# Patient Record
Sex: Female | Born: 1950 | Race: Black or African American | Hispanic: No | Smoking: Never smoker
Health system: Southern US, Community
[De-identification: ages and names within clinical notes are randomized; demographics above are authoritative.]

## PROBLEM LIST (undated history)

## (undated) DIAGNOSIS — R259 Unspecified abnormal involuntary movements: Secondary | ICD-10-CM

## (undated) DIAGNOSIS — G252 Other specified forms of tremor: Secondary | ICD-10-CM

## (undated) DIAGNOSIS — I10 Essential (primary) hypertension: Secondary | ICD-10-CM

## (undated) DIAGNOSIS — R251 Tremor, unspecified: Secondary | ICD-10-CM

## (undated) DIAGNOSIS — M47812 Spondylosis without myelopathy or radiculopathy, cervical region: Secondary | ICD-10-CM

## (undated) DIAGNOSIS — E7800 Pure hypercholesterolemia, unspecified: Secondary | ICD-10-CM

## (undated) HISTORY — DX: Unspecified abnormal involuntary movements: R25.9

## (undated) HISTORY — PX: OTHER SURGICAL HISTORY: SHX169

## (undated) HISTORY — PX: GANGLION CYST EXCISION: SHX1691

## (undated) HISTORY — DX: Tremor, unspecified: R25.1

## (undated) HISTORY — DX: Spondylosis without myelopathy or radiculopathy, cervical region: M47.812

## (undated) HISTORY — DX: Other specified forms of tremor: G25.2

---

## 1998-01-08 ENCOUNTER — Emergency Department (HOSPITAL_COMMUNITY): Admission: EM | Admit: 1998-01-08 | Discharge: 1998-01-09 | Payer: Self-pay | Admitting: Emergency Medicine

## 2007-03-09 ENCOUNTER — Encounter: Admission: RE | Admit: 2007-03-09 | Discharge: 2007-03-09 | Payer: Self-pay | Admitting: Family Medicine

## 2007-05-06 ENCOUNTER — Emergency Department (HOSPITAL_COMMUNITY): Admission: EM | Admit: 2007-05-06 | Discharge: 2007-05-07 | Payer: Self-pay | Admitting: Emergency Medicine

## 2007-05-08 ENCOUNTER — Ambulatory Visit (HOSPITAL_BASED_OUTPATIENT_CLINIC_OR_DEPARTMENT_OTHER): Admission: RE | Admit: 2007-05-08 | Discharge: 2007-05-09 | Payer: Self-pay | Admitting: Orthopedic Surgery

## 2008-02-29 ENCOUNTER — Encounter: Admission: RE | Admit: 2008-02-29 | Discharge: 2008-02-29 | Payer: Self-pay | Admitting: Family Medicine

## 2008-03-12 ENCOUNTER — Encounter: Admission: RE | Admit: 2008-03-12 | Discharge: 2008-03-12 | Payer: Self-pay | Admitting: Family Medicine

## 2010-03-09 ENCOUNTER — Encounter (INDEPENDENT_AMBULATORY_CARE_PROVIDER_SITE_OTHER): Payer: Self-pay | Admitting: *Deleted

## 2010-03-09 LAB — CONVERTED CEMR LAB
ALT: 27 units/L (ref 0–35)
BUN: 17 mg/dL (ref 6–23)
CO2: 29 meq/L (ref 19–32)
Calcium: 9.8 mg/dL (ref 8.4–10.5)
Chloride: 102 meq/L (ref 96–112)
Cholesterol: 244 mg/dL — ABNORMAL HIGH (ref 0–200)
Creatinine, Ser: 0.73 mg/dL (ref 0.40–1.20)
Glucose, Bld: 91 mg/dL (ref 70–99)
HCT: 40.4 % (ref 36.0–46.0)
Hemoglobin: 12.4 g/dL (ref 12.0–15.0)
MCV: 78.1 fL (ref 78.0–100.0)
RBC: 5.17 M/uL — ABNORMAL HIGH (ref 3.87–5.11)
Total Bilirubin: 0.7 mg/dL (ref 0.3–1.2)
Total CHOL/HDL Ratio: 3.8
Triglycerides: 100 mg/dL (ref <150)
VLDL: 20 mg/dL (ref 0–40)
WBC: 4.5 10*3/uL (ref 4.0–10.5)

## 2010-03-18 ENCOUNTER — Encounter: Admission: RE | Admit: 2010-03-18 | Discharge: 2010-03-18 | Payer: Self-pay | Admitting: Family Medicine

## 2010-05-21 ENCOUNTER — Other Ambulatory Visit: Payer: Self-pay | Admitting: Family Medicine

## 2010-05-21 ENCOUNTER — Other Ambulatory Visit (HOSPITAL_COMMUNITY): Payer: Self-pay | Admitting: Family Medicine

## 2010-05-21 ENCOUNTER — Other Ambulatory Visit (HOSPITAL_COMMUNITY)
Admission: RE | Admit: 2010-05-21 | Discharge: 2010-05-21 | Disposition: A | Discharge status: Home / Self Care | Payer: Self-pay | Source: Ambulatory Visit | Attending: Family Medicine | Admitting: Family Medicine

## 2010-05-21 DIAGNOSIS — R319 Hematuria, unspecified: Secondary | ICD-10-CM | POA: Insufficient documentation

## 2010-05-22 ENCOUNTER — Encounter (INDEPENDENT_AMBULATORY_CARE_PROVIDER_SITE_OTHER): Payer: Self-pay | Admitting: *Deleted

## 2010-05-22 LAB — CONVERTED CEMR LAB
Casts: NONE SEEN /lpf
Chlamydia, DNA Probe: NEGATIVE
Crystals: NONE SEEN
GC Probe Amp, Genital: NEGATIVE

## 2010-05-24 ENCOUNTER — Encounter (HOSPITAL_COMMUNITY): Payer: Self-pay

## 2010-05-24 ENCOUNTER — Ambulatory Visit (HOSPITAL_COMMUNITY)
Admission: RE | Admit: 2010-05-24 | Discharge: 2010-05-24 | Disposition: A | Discharge status: Home / Self Care | Payer: Self-pay | Source: Ambulatory Visit | Attending: Family Medicine | Admitting: Family Medicine

## 2010-05-24 DIAGNOSIS — R109 Unspecified abdominal pain: Secondary | ICD-10-CM | POA: Insufficient documentation

## 2010-05-24 DIAGNOSIS — K5730 Diverticulosis of large intestine without perforation or abscess without bleeding: Secondary | ICD-10-CM | POA: Insufficient documentation

## 2010-05-24 DIAGNOSIS — R319 Hematuria, unspecified: Secondary | ICD-10-CM | POA: Insufficient documentation

## 2010-05-24 DIAGNOSIS — M5137 Other intervertebral disc degeneration, lumbosacral region: Secondary | ICD-10-CM | POA: Insufficient documentation

## 2010-05-24 DIAGNOSIS — M51379 Other intervertebral disc degeneration, lumbosacral region without mention of lumbar back pain or lower extremity pain: Secondary | ICD-10-CM | POA: Insufficient documentation

## 2010-08-31 NOTE — Op Note (Signed)
NAMEACHSAH, MCQUADE NO.:  [id number]   MEDICAL RECORD NO.:  [id number]          PATIENT TYPE:  AMB   LOCATION:  DSC                          FACILITY:  [facility]   PHYSICIAN:  Dyke Brackett, M.D.    DATE OF BIRTH:  August 15, 1950   DATE OF PROCEDURE:  05/08/2007  DATE OF DISCHARGE:                               OPERATIVE REPORT   INDICATIONS:  A 60 year old with a displaced trimalleolar ankle fracture  thought to be amenable to outpatient surgery.   PRE-AND-POSTOPERATIVE DIAGNOSIS:  Mildly displaced trimalleolar ankle  fracture.   OPERATION:  Open reduction, internal fixation trimalleolar ankle  fracture (six-hole semitubular plate with lag screw, 240 cancellus  screws).   SURGEON:  Dyke Brackett, M.D.   ASSISTANT:  Legrand Pitts. Duffy, P.A.   TOURNIQUET TIME:  1 hour.   DESCRIPTION OF PROCEDURE:  Sterile prep and drape, exsanguination of the  leg, and placed into 350.  Straight skin incision was made.  Identification of oblique Weber type-B fibular fracture which was fixed  with a six-hole plate with appropriate vent in the plate.  Moderate Lee  good purchase was obtained distally with one lag screw placed front-to-  back in the fracture with anatomic reduction. confirmed in 3 plains.  This was followed by opening the medial side of the curvilinear incision  with an anatomic reduction of a medial malleolus fracture with fixation  with two 4-0 cancellus screws, again, anatomic reduction with good  compression of the fracture site confirmation of screws all within bone  and a good reduction on AP lateral and mortise views.  The wounds were  irrigated closed with #0 Vicryl, 2-0 Vicryl, and skin clips.  A lightly  compressive sterile dressing and a posterior splint applied.  Taken to  recovery room in stable condition.      Dyke Brackett, M.D.  Electronically Signed     WDC/MEDQ  D:  05/08/2007  T:  05/08/2007  Job:  [id number]

## 2011-01-07 LAB — POCT HEMOGLOBIN-HEMACUE: Hemoglobin: 11.6 — ABNORMAL LOW

## 2011-02-24 ENCOUNTER — Other Ambulatory Visit: Payer: Self-pay | Admitting: Family Medicine

## 2011-02-24 DIAGNOSIS — Z1231 Encounter for screening mammogram for malignant neoplasm of breast: Secondary | ICD-10-CM

## 2011-03-25 ENCOUNTER — Ambulatory Visit: Payer: Self-pay

## 2011-04-01 ENCOUNTER — Ambulatory Visit
Admission: RE | Admit: 2011-04-01 | Discharge: 2011-04-01 | Disposition: A | Discharge status: Home / Self Care | Payer: Self-pay | Source: Ambulatory Visit | Attending: Family Medicine | Admitting: Family Medicine

## 2011-04-01 DIAGNOSIS — Z1231 Encounter for screening mammogram for malignant neoplasm of breast: Secondary | ICD-10-CM

## 2012-09-05 ENCOUNTER — Other Ambulatory Visit (HOSPITAL_COMMUNITY): Payer: Self-pay | Admitting: Family Medicine

## 2012-09-05 DIAGNOSIS — Z1231 Encounter for screening mammogram for malignant neoplasm of breast: Secondary | ICD-10-CM

## 2012-09-20 ENCOUNTER — Ambulatory Visit (HOSPITAL_COMMUNITY)
Admission: RE | Admit: 2012-09-20 | Discharge: 2012-09-20 | Disposition: A | Discharge status: Home / Self Care | Payer: No Typology Code available for payment source | Source: Ambulatory Visit | Attending: Family Medicine | Admitting: Family Medicine

## 2012-09-20 DIAGNOSIS — Z1231 Encounter for screening mammogram for malignant neoplasm of breast: Secondary | ICD-10-CM

## 2013-08-22 ENCOUNTER — Other Ambulatory Visit (HOSPITAL_COMMUNITY): Payer: Self-pay | Admitting: Family Medicine

## 2013-08-22 DIAGNOSIS — Z1231 Encounter for screening mammogram for malignant neoplasm of breast: Secondary | ICD-10-CM

## 2013-09-13 ENCOUNTER — Other Ambulatory Visit: Payer: Self-pay | Admitting: Orthopedic Surgery

## 2013-09-13 DIAGNOSIS — R251 Tremor, unspecified: Secondary | ICD-10-CM

## 2013-09-20 ENCOUNTER — Other Ambulatory Visit: Payer: No Typology Code available for payment source

## 2013-09-23 ENCOUNTER — Encounter (INDEPENDENT_AMBULATORY_CARE_PROVIDER_SITE_OTHER): Payer: Self-pay

## 2013-09-23 ENCOUNTER — Ambulatory Visit (HOSPITAL_COMMUNITY): Payer: No Typology Code available for payment source

## 2013-09-23 ENCOUNTER — Ambulatory Visit
Admission: RE | Admit: 2013-09-23 | Discharge: 2013-09-23 | Disposition: A | Discharge status: Home / Self Care | Payer: Commercial Managed Care - HMO | Source: Ambulatory Visit | Attending: Orthopedic Surgery | Admitting: Orthopedic Surgery

## 2013-09-23 ENCOUNTER — Other Ambulatory Visit: Payer: Self-pay | Admitting: Orthopedic Surgery

## 2013-09-23 DIAGNOSIS — IMO0002 Reserved for concepts with insufficient information to code with codable children: Secondary | ICD-10-CM

## 2013-09-23 DIAGNOSIS — R251 Tremor, unspecified: Secondary | ICD-10-CM

## 2013-09-24 ENCOUNTER — Ambulatory Visit (HOSPITAL_COMMUNITY)
Admission: RE | Admit: 2013-09-24 | Discharge: 2013-09-24 | Disposition: A | Discharge status: Home / Self Care | Payer: Medicare HMO | Source: Ambulatory Visit | Attending: Family Medicine | Admitting: Family Medicine

## 2013-09-24 DIAGNOSIS — Z1231 Encounter for screening mammogram for malignant neoplasm of breast: Secondary | ICD-10-CM

## 2013-09-25 ENCOUNTER — Other Ambulatory Visit: Payer: Self-pay | Admitting: Family Medicine

## 2013-09-25 DIAGNOSIS — R928 Other abnormal and inconclusive findings on diagnostic imaging of breast: Secondary | ICD-10-CM

## 2013-10-15 ENCOUNTER — Ambulatory Visit
Admission: RE | Admit: 2013-10-15 | Discharge: 2013-10-15 | Disposition: A | Discharge status: Home / Self Care | Payer: Commercial Managed Care - HMO | Source: Ambulatory Visit | Attending: Family Medicine | Admitting: Family Medicine

## 2013-10-15 DIAGNOSIS — R928 Other abnormal and inconclusive findings on diagnostic imaging of breast: Secondary | ICD-10-CM

## 2014-01-02 ENCOUNTER — Encounter: Payer: Self-pay | Admitting: Neurology

## 2014-01-03 ENCOUNTER — Ambulatory Visit: Payer: Commercial Managed Care - HMO | Admitting: Neurology

## 2014-01-16 ENCOUNTER — Ambulatory Visit (INDEPENDENT_AMBULATORY_CARE_PROVIDER_SITE_OTHER): Payer: Medicare HMO | Admitting: Neurology

## 2014-01-16 ENCOUNTER — Encounter: Payer: Self-pay | Admitting: Neurology

## 2014-01-16 ENCOUNTER — Encounter (INDEPENDENT_AMBULATORY_CARE_PROVIDER_SITE_OTHER): Payer: Self-pay

## 2014-01-16 VITALS — BP 106/61 | HR 65 | Ht 62.5 in | Wt 150.0 lb

## 2014-01-16 DIAGNOSIS — R251 Tremor, unspecified: Secondary | ICD-10-CM

## 2014-01-16 HISTORY — DX: Tremor, unspecified: R25.1

## 2014-01-16 NOTE — Patient Instructions (Signed)

## 2014-01-16 NOTE — Progress Notes (Signed)
Reason for visit: Tremor  Carol BushmanDorothy Freeman is a 63 y.o. female  History of present illness:  Ms. Carol Freeman is a 63 year old right-handed black female with a history of tremor that she believes began about 4 months ago. The patient indicates that the tremor involves the right arm only, and she generally will note the tremor when she is performing handwriting, or trying to floss her teeth. The patient may drop things from the right hand. She reports no numbness or weakness or discomfort involving the arm. She denies any family history of tremor. She denies any tremor involving the head or neck, or any vocal tremor. The patient denies any balance problems or difficulty controlling the bowels or the bladder. She indicates that the tremor does not prevent her from performing any activities of daily living. The patient has undergone a CT scan of the brain in June of 2015, and the study was unremarkable. The patient is sent to this office for an evaluation. The patient has good days and bad days with the tremor.  Past Medical History  Diagnosis Date  . Cervical spondylosis   . Resting tremor     hand  . Tremor of right hand 01/16/2014    Past Surgical History  Procedure Laterality Date  . Ganglion cyst excision    . Broken leg    . Still born      Family History  Problem Relation Age of Onset  . Tremor Neg Hx     Social history:  reports that she has never smoked. She has never used smokeless tobacco. She reports that she drinks alcohol. She reports that she does not use illicit drugs.  Medications:  No current outpatient prescriptions on file prior to visit.   No current facility-administered medications on file prior to visit.      Allergies  Allergen Reactions  . Lipitor [Atorvastatin] Other (See Comments)    ROS:  Out of a complete 14 system review of symptoms, the patient complains only of the following symptoms, and all other reviewed systems are negative.  Fatigue Achy  muscles Weakness, tremor Not enough sleep Restless legs  Blood pressure 106/61, pulse 65, height 5' 2.5" (1.588 m), weight 150 lb (68.04 kg).  Physical Exam  General: The patient is alert and cooperative at the time of the examination.  Eyes: Pupils are equal, round, and reactive to light. Discs are flat bilaterally.  Neck: The neck is supple, no carotid bruits are noted.  Respiratory: The respiratory examination is clear.  Cardiovascular: The cardiovascular examination reveals a regular rate and rhythm, no obvious murmurs or rubs are noted.  Skin: Extremities are without significant edema.  Neurologic Exam  Mental status: The patient is alert and oriented x 3 at the time of the examination. The patient has apparent normal recent and remote memory, with an apparently normal attention span and concentration ability.  Cranial nerves: Facial symmetry is present. There is good sensation of the face to pinprick and soft touch bilaterally. The strength of the facial muscles and the muscles to head turning and shoulder shrug are normal bilaterally. Speech is well enunciated, no aphasia or dysarthria is noted. Extraocular movements are full. Visual fields are full. The tongue is midline, and the patient has symmetric elevation of the soft palate. No obvious hearing deficits are noted.  Motor: The motor testing reveals 5 over 5 strength of all 4 extremities. Good symmetric motor tone is noted throughout.  Sensory: Sensory testing is intact to pinprick,  soft touch, vibration sensation, and position sense on all 4 extremities. No evidence of extinction is noted.  Coordination: Cerebellar testing reveals good finger-nose-finger and heel-to-shin bilaterally. The patient does have a mild intention tremor with finger-nose-finger on the right arm, trace tremor on the left.  Gait and station: Gait is normal. Tandem gait is normal. Romberg is negative. No drift is seen.  Reflexes: Deep tendon  reflexes are symmetric and normal bilaterally. Toes are downgoing bilaterally.   Assessment/Plan:  1. Probable essential tremor  The patient will be set up for blood work today, and MRI evaluation of the brain. The patient does not wish to go on medications currently for the tremor. She will followup in about 6 months, and we will reevaluate the tremor that time. If needed, medications can be started in the future.   Carol Palau MD 01/17/2014 5:57 AM  [facility] [address] [place], [place] [address]  Phone [phone] Fax [phone]

## 2014-01-31 ENCOUNTER — Telehealth: Payer: Self-pay | Admitting: Neurology

## 2014-01-31 ENCOUNTER — Ambulatory Visit
Admission: RE | Admit: 2014-01-31 | Discharge: 2014-01-31 | Disposition: A | Discharge status: Home / Self Care | Payer: Commercial Managed Care - HMO | Source: Ambulatory Visit | Attending: Neurology | Admitting: Neurology

## 2014-01-31 DIAGNOSIS — R251 Tremor, unspecified: Secondary | ICD-10-CM

## 2014-01-31 NOTE — Telephone Encounter (Signed)
  I called patient. MRI the brain is almost normal, only a few small white matter lesions. I discussed this with the patient.  MRI brain 01/31/2014:  Impression   Mildly abnormal MRI brain (without) demonstrating: 1. Few scattered periventricular and subcortical foci of non-specific  gliosis, likely chronic small vessel ischemic disease.  2. No acute findings.

## 2014-03-05 ENCOUNTER — Encounter: Payer: Self-pay | Admitting: Neurology

## 2014-03-11 ENCOUNTER — Encounter: Payer: Self-pay | Admitting: Neurology

## 2014-07-18 ENCOUNTER — Ambulatory Visit: Payer: Medicare HMO | Admitting: Adult Health

## 2014-07-22 ENCOUNTER — Ambulatory Visit (INDEPENDENT_AMBULATORY_CARE_PROVIDER_SITE_OTHER): Payer: Medicare HMO | Admitting: Adult Health

## 2014-07-22 ENCOUNTER — Encounter: Payer: Self-pay | Admitting: Adult Health

## 2014-07-22 VITALS — BP 111/69 | HR 81 | Ht 62.5 in | Wt 156.0 lb

## 2014-07-22 DIAGNOSIS — G250 Essential tremor: Secondary | ICD-10-CM

## 2014-07-22 NOTE — Progress Notes (Signed)
PATIENT: Carol BushmanDorothy Freeman DOB: [birth year]  REASON FOR VISIT: follow up- tremor HISTORY FROM: patient  HISTORY OF PRESENT ILLNESS: Ms. Carol Freeman is a 64 year old female with a history of benign essential tremor. She returns today for follow-up. The patient states that she continues to have a tremor in the right hand. She continues tp notice it when she is writing or flossing her teeth. She states she will occasionally notice it when she is drinking tea--she has to hold the cup with 2 hands. At this time she denies having any issues when she is eating. Overall she feels that the tremor has not changed much. She would rather not be on medication unless it gets worse. She denies any new neurological symptoms. Denies any new medical issues. She returns today for evaluation  HISTORY 01/16/14 (Carol Freeman): Ms. Carol Freeman is a 64 year old right-handed black female with a history of tremor that she believes began about 4 months ago. The patient indicates that the tremor involves the right arm only, and she generally will note the tremor when she is performing handwriting, or trying to floss her teeth. The patient may drop things from the right hand. She reports no numbness or weakness or discomfort involving the arm. She denies any family history of tremor. She denies any tremor involving the head or neck, or any vocal tremor. The patient denies any balance problems or difficulty controlling the bowels or the bladder. She indicates that the tremor does not prevent her from performing any activities of daily living. The patient has undergone a CT scan of the brain in June of 2015, and the study was unremarkable. The patient is sent to this office for an evaluation. The patient has good days and bad days with the tremor.  REVIEW OF SYSTEMS: Out of a complete 14 system review of symptoms, the patient complains only of the following symptoms, and all other reviewed systems are negative.  See HPI   ALLERGIES: Allergies    Allergen Reactions  . Lipitor [Atorvastatin] Other (See Comments)    HOME MEDICATIONS: Outpatient Prescriptions Prior to Visit  Medication Sig Dispense Refill  . aspirin 81 MG tablet Take 81 mg by mouth every other day.    . lisinopril-hydrochlorothiazide (PRINZIDE,ZESTORETIC) 10-12.5 MG per tablet     . pravastatin (PRAVACHOL) 20 MG tablet      No facility-administered medications prior to visit.    PAST MEDICAL HISTORY: Past Medical History  Diagnosis Date  . Cervical spondylosis   . Resting tremor     hand  . Tremor of right hand 01/16/2014    PAST SURGICAL HISTORY: Past Surgical History  Procedure Laterality Date  . Ganglion cyst excision    . Broken leg    . Still born      FAMILY HISTORY: Family History  Problem Relation Age of Onset  . Tremor Neg Hx     SOCIAL HISTORY: History   Social History  . Marital Status: Divorced    Spouse Name: N/A  . Number of Children: 1  . Years of Education: 12   Occupational History  . Retired    Social History Main Topics  . Smoking status: Never Smoker   . Smokeless tobacco: Never Used  . Alcohol Use: Yes     Comment: Red wine once in a while  . Drug Use: No  . Sexual Activity: Not on file   Other Topics Concern  . Not on file   Social History Narrative   Patient lives alone.  Patient is divorced.    Patient has 1 child.    Patient right handed.    Patient is retired.    Patient has a high school education.       PHYSICAL EXAM  Filed Vitals:   07/22/14 1432  BP: 111/69  Pulse: 81  Height: 5' 2.5" (1.588 m)  Weight: 156 lb (70.761 kg)   Body mass index is 28.06 kg/(m^2).  Generalized: Well developed, in no acute distress   Neurological examination  Mentation: Alert oriented to time, place, history taking. Follows all commands speech and language fluent Cranial nerve II-XII: Pupils were equal round reactive to light. Extraocular movements were full, visual field were full on confrontational  test. Facial sensation and strength were normal. Uvula tongue midline. Head turning and shoulder shrug  were normal and symmetric. Motor: The motor testing reveals 5 over 5 strength of all 4 extremities. Good symmetric motor tone is noted throughout. Intention tremor noted in the right hand. Sensory: Sensory testing is intact to soft touch on all 4 extremities. No evidence of extinction is noted.  Coordination: Cerebellar testing reveals good finger-nose-finger and heel-to-shin bilaterally.  Gait and station: Gait is normal.  Reflexes: Deep tendon reflexes are symmetric and normal bilaterally.  Marland Kitchen   DIAGNOSTIC DATA (LABS, IMAGING, TESTING) - I reviewed patient records, labs, notes, testing and imaging myself where available.      ASSESSMENT AND PLAN 64 y.o. year old female  has a past medical history of Cervical spondylosis; Resting tremor; and Tremor of right hand (01/16/2014). here with:  1. Benign essential tremor  Patient's tremor has remained the same. At this time the patient would rather not have any medication. I have explained to the patient that if her tremor worsens we can try daily medication or medication as needed. She verbalized understanding. She will let us know if her tremor worsens. She will follow-up in 6 months or sooner if needed.  Carol Freeman, Carol Freeman, Carol Freeman 07/22/2014, 2:49 PM [facility] [address], [address] [place], [place] [address] [phone]  Note: This document was prepared with digital dictation and possible smart phrase technology. Any transcriptional errors that result from this process are unintentional.

## 2014-07-22 NOTE — Patient Instructions (Signed)
Overall you are doing well. Your tremor is mild. If it worsens please let us know and at that time we can consider medication.

## 2014-07-22 NOTE — Progress Notes (Signed)
I have read the note, and I agree with the clinical assessment and plan.  Lexine Jaspers KEITH   

## 2014-09-24 ENCOUNTER — Other Ambulatory Visit (HOSPITAL_COMMUNITY): Payer: Self-pay | Admitting: Internal Medicine

## 2014-09-24 DIAGNOSIS — Z1231 Encounter for screening mammogram for malignant neoplasm of breast: Secondary | ICD-10-CM

## 2014-10-24 ENCOUNTER — Ambulatory Visit (HOSPITAL_COMMUNITY)
Admission: RE | Admit: 2014-10-24 | Discharge: 2014-10-24 | Disposition: A | Discharge status: Home / Self Care | Payer: Medicare HMO | Source: Ambulatory Visit | Attending: Internal Medicine | Admitting: Internal Medicine

## 2014-10-24 DIAGNOSIS — Z1231 Encounter for screening mammogram for malignant neoplasm of breast: Secondary | ICD-10-CM | POA: Diagnosis present

## 2015-01-19 ENCOUNTER — Encounter: Payer: Self-pay | Admitting: Adult Health

## 2015-01-19 ENCOUNTER — Ambulatory Visit (INDEPENDENT_AMBULATORY_CARE_PROVIDER_SITE_OTHER): Payer: Medicare HMO | Admitting: Adult Health

## 2015-01-19 VITALS — BP 114/68 | HR 65 | Ht 62.5 in | Wt 154.8 lb

## 2015-01-19 DIAGNOSIS — G250 Essential tremor: Secondary | ICD-10-CM

## 2015-01-19 NOTE — Patient Instructions (Signed)
We will continue to monitor tremor.  If it gets worse please let us know.

## 2015-01-19 NOTE — Progress Notes (Addendum)
PATIENT: Carol Freeman DOB: 07-05-1950  REASON FOR VISIT: follow up-benign essential tremor HISTORY FROM: patient  HISTORY OF PRESENT ILLNESS: Ms. Carol Freeman is a 64 year old female with a history of benign essential tremor. She returns today for follow-up. The patient is currently not on any medications for her tremors. She states that she primarily notices that if she becomes very nervous or  is in a rush. The tremor is primarily located in the hands. She will sometimes notice it in her hand writing or when eating. Patient denies any new neurological symptoms. She returns today for an evaluation.  HISTORY  07/22/14: [name] is a 64 year old female with a history of benign essential tremor. She returns today for follow-up. The patient states that she continues to have a tremor in the right hand. She continues tp notice it when she is writing or flossing her teeth. She states she will occasionally notice it when she is drinking tea--she has to hold the cup with 2 hands. At this time she denies having any issues when she is eating. Overall she feels that the tremor has not changed much. She would rather not be on medication unless it gets worse. She denies any new neurological symptoms. Denies any new medical issues. She returns today for evaluation  HISTORY 01/16/14 (WILLIS): Ms. Carol Freeman is a 64 year old right-handed black female with a history of tremor that she believes began about 4 months ago. The patient indicates that the tremor involves the right arm only, and she generally will note the tremor when she is performing handwriting, or trying to floss her teeth. The patient may drop things from the right hand. She reports no numbness or weakness or discomfort involving the arm. She denies any family history of tremor. She denies any tremor involving the head or neck, or any vocal tremor. The patient denies any balance problems or difficulty controlling the bowels or the bladder. She indicates that  the tremor does not prevent her from performing any activities of daily living. The patient has undergone a CT scan of the brain in June of 2015, and the study was unremarkable. The patient is sent to this office for an evaluation. The patient has good days and bad days with the tremor.  REVIEW OF SYSTEMS: Out of a complete 14 system review of symptoms, the patient complains only of the following symptoms, and all other reviewed systems are negative.  Eye itching, frequent waking  ALLERGIES: Allergies  Allergen Reactions  . Lipitor [Atorvastatin] Other (See Comments)    HOME MEDICATIONS: Outpatient Prescriptions Prior to Visit  Medication Sig Dispense Refill  . aspirin 81 MG tablet Take 81 mg by mouth every other day.    . lisinopril-hydrochlorothiazide (PRINZIDE,ZESTORETIC) 10-12.5 MG per tablet     . pravastatin (PRAVACHOL) 20 MG tablet      No facility-administered medications prior to visit.    PAST MEDICAL HISTORY: Past Medical History  Diagnosis Date  . Cervical spondylosis   . Resting tremor     hand  . Tremor of right hand 01/16/2014    PAST SURGICAL HISTORY: Past Surgical History  Procedure Laterality Date  . Ganglion cyst excision    . Broken leg    . Still born      FAMILY HISTORY: Family History  Problem Relation Age of Onset  . Tremor Neg Hx     SOCIAL HISTORY: Social History   Social History  . Marital Status: Divorced    Spouse Name: N/A  . Number  of Children: 1  . Years of Education: 12   Occupational History  . Retired    Social History Main Topics  . Smoking status: Never Smoker   . Smokeless tobacco: Never Used  . Alcohol Use: Yes     Comment: Red wine once in a while  . Drug Use: No  . Sexual Activity: Not on file   Other Topics Concern  . Not on file   Social History Narrative   Patient lives alone.    Patient is divorced.    Patient has 1 child.    Patient right handed.    Patient is retired.    Patient has a high school  education.       PHYSICAL EXAM  Filed Vitals:   01/19/15 1339  BP: 114/68  Pulse: 65  Height: 5' 2.5" (1.588 m)  Weight: 154 lb 12 oz (70.194 kg)   Body mass index is 27.84 kg/(m^2).  Generalized: Well developed, in no acute distress   Neurological examination  Mentation: Alert oriented to time, place, history taking. Follows all commands speech and language fluent Cranial nerve II-XII: Pupils were equal round reactive to light. Extraocular movements were full, visual field were full on confrontational test. Facial sensation and strength were normal. Uvula tongue midline. Head turning and shoulder shrug  were normal and symmetric. Motor: The motor testing reveals 5 over 5 strength of all 4 extremities. Good symmetric motor tone is noted throughout.  Sensory: Sensory testing is intact to soft touch on all 4 extremities. No evidence of extinction is noted.  Coordination: Cerebellar testing reveals good finger-nose-finger and heel-to-shin bilaterally.  Gait and station: Gait is normal. Tandem gait is normal. Romberg is negative. No drift is seen.  Reflexes: Deep tendon reflexes are symmetric and normal bilaterally.   DIAGNOSTIC DATA (LABS, IMAGING, TESTING) - I reviewed patient records, labs, notes, testing and imaging myself where available.   ASSESSMENT AND PLAN 64 y.o. year old female  has a past medical history of Cervical spondylosis; Resting tremor; and Tremor of right hand (01/16/2014). here with:  1. Tremor  Overall the patient is doing well. Her tremor has not gotten any worse since the last visit. She does not want to be on any medication at this time. We will continue to monitor her tremor over time. If her tremor worsen she should let us know. She will follow-up in one year or sooner if needed.  Butch Penny, MSN, NP-C 01/19/2015, 1:44 PM [facility] [address], [address] [place], [place] [address] [phone]  I reviewed the above note and  documentation by the Nurse Practitioner and agree with the history, physical exam, assessment and plan as outlined above. I was immediately available for face-to-face consultation. Huston Foley, MD, PhD [facility])

## 2015-05-12 ENCOUNTER — Other Ambulatory Visit: Payer: Self-pay | Admitting: Internal Medicine

## 2015-05-12 DIAGNOSIS — E2839 Other primary ovarian failure: Secondary | ICD-10-CM

## 2015-05-22 ENCOUNTER — Ambulatory Visit
Admission: RE | Admit: 2015-05-22 | Discharge: 2015-05-22 | Disposition: A | Discharge status: Home / Self Care | Payer: Medicare HMO | Source: Ambulatory Visit | Attending: Internal Medicine | Admitting: Internal Medicine

## 2015-05-22 DIAGNOSIS — E2839 Other primary ovarian failure: Secondary | ICD-10-CM

## 2015-10-26 ENCOUNTER — Other Ambulatory Visit: Payer: Self-pay | Admitting: Internal Medicine

## 2015-10-26 DIAGNOSIS — Z1231 Encounter for screening mammogram for malignant neoplasm of breast: Secondary | ICD-10-CM

## 2015-11-06 ENCOUNTER — Ambulatory Visit
Admission: RE | Admit: 2015-11-06 | Discharge: 2015-11-06 | Disposition: A | Discharge status: Home / Self Care | Payer: Medicare HMO | Source: Ambulatory Visit | Attending: Internal Medicine | Admitting: Internal Medicine

## 2015-11-06 DIAGNOSIS — Z1231 Encounter for screening mammogram for malignant neoplasm of breast: Secondary | ICD-10-CM

## 2015-11-20 ENCOUNTER — Ambulatory Visit: Payer: Medicare HMO

## 2015-12-01 IMAGING — CR DG CERVICAL SPINE COMPLETE 4+V
5 series · 5 of 5 positions shown · non-contrast
Comparison: None.

CLINICAL DATA: Left arm pain and numbness for 2 months. No known
injury.

EXAM:
CERVICAL SPINE  4+ VIEWS

[w c-spine lat]
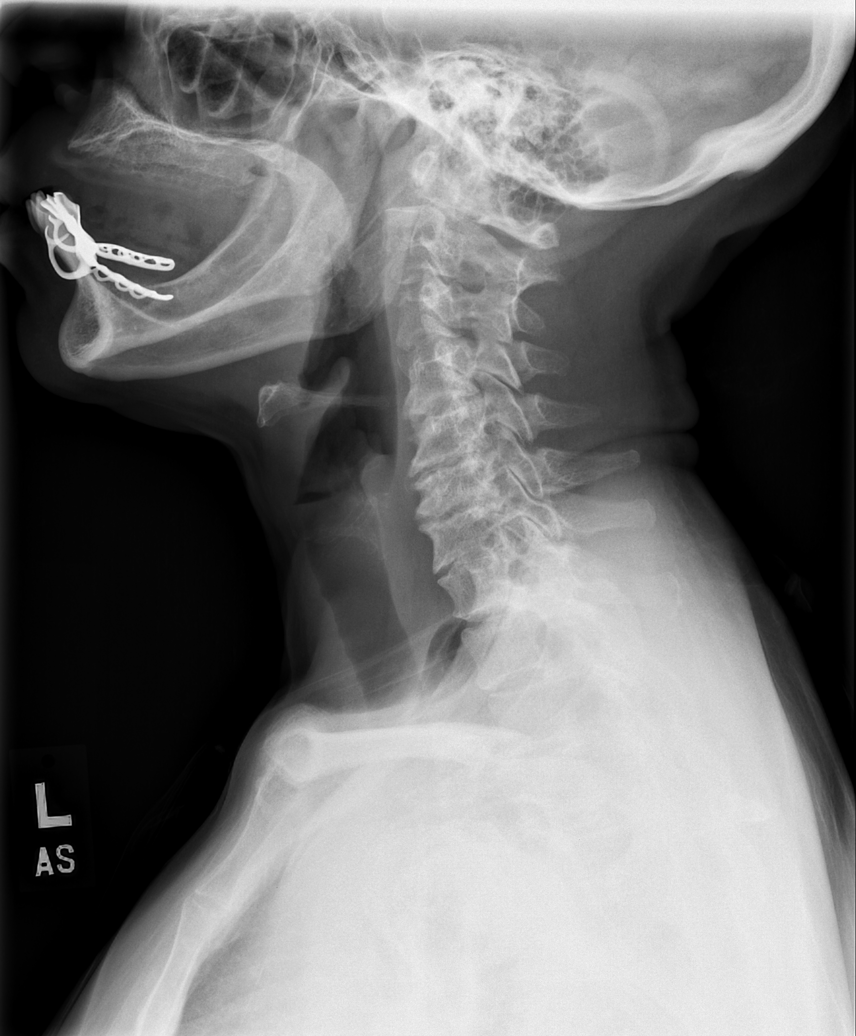

[w c-spine oblique (1 of 2)]
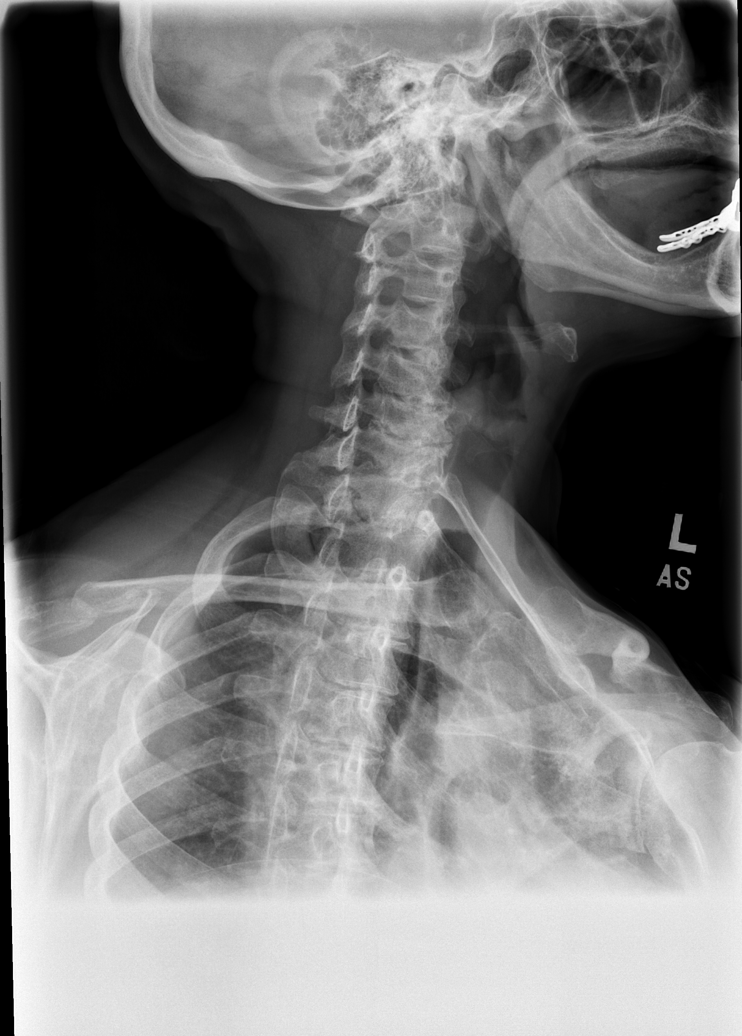

[w c-spine oblique (2 of 2)]
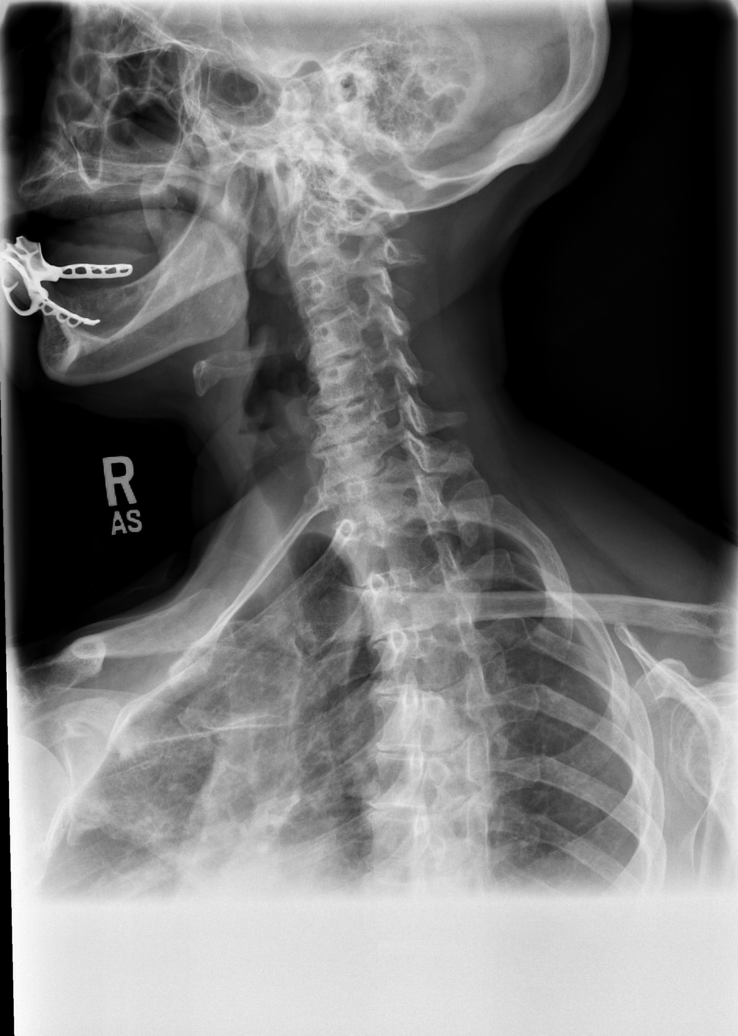

[w c-spine a.p. *]
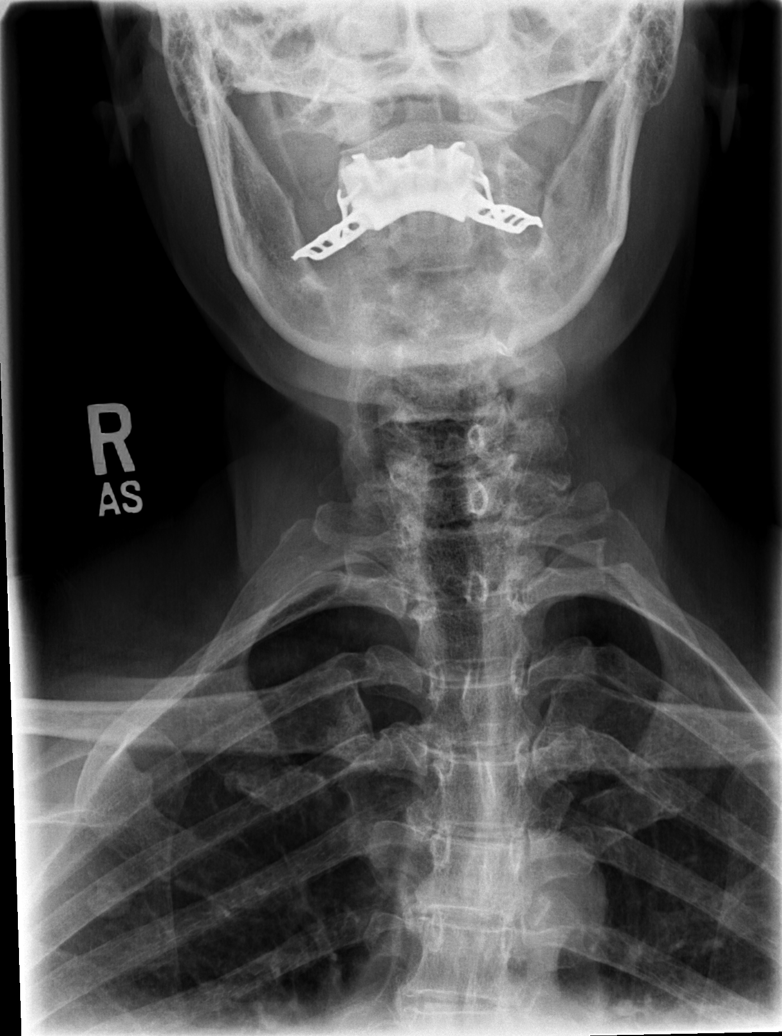

[w c-spine odontoid *]
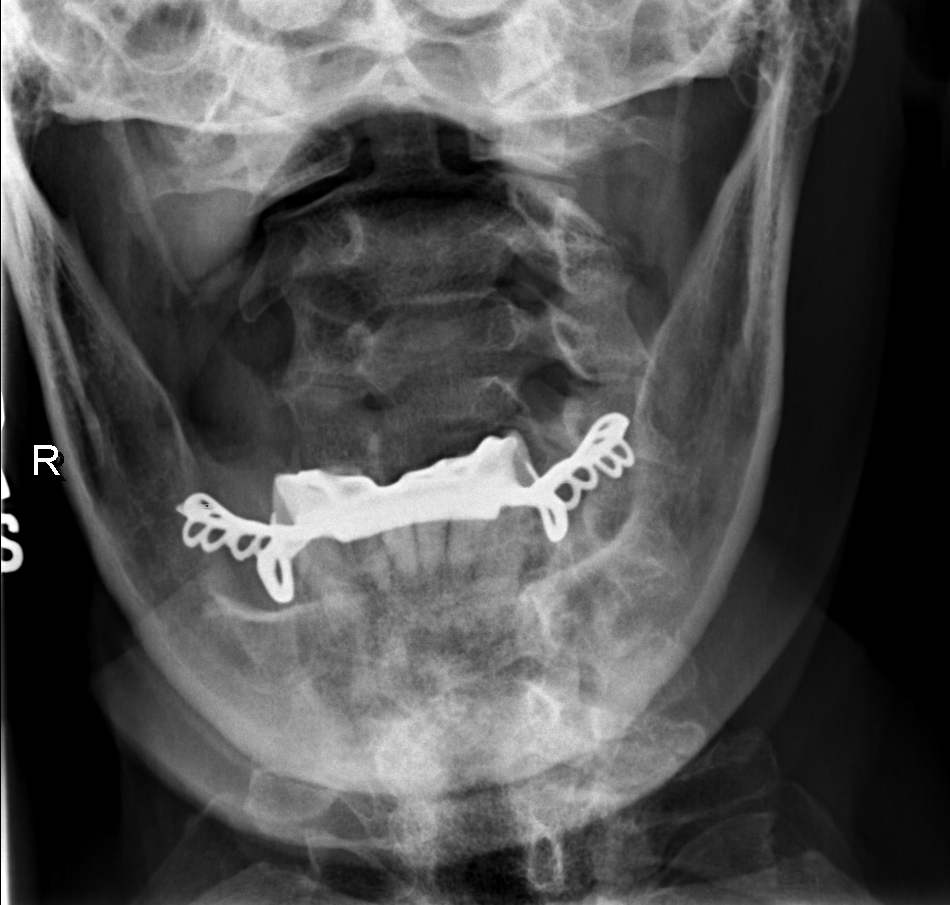

[5 of 5 positions shown; findings below may reference images not displayed]

FINDINGS: The prevertebral soft tissues are normal. The alignment is anatomic
through T1. There is no evidence of acute fracture or traumatic
subluxation. The C1-2 articulation appears normal in the AP
projection. There is multilevel spondylosis with disc space loss and
uncinate spurring, greatest from C4-5 through C7-T1. The oblique
views demonstrate no high-grade or asymmetric osseous foraminal
stenosis.
IMPRESSION: Multilevel cervical spondylosis without high-grade osseous foraminal
stenosis or acute osseous findings.

## 2015-12-07 ENCOUNTER — Ambulatory Visit: Payer: Medicare HMO

## 2016-01-25 ENCOUNTER — Ambulatory Visit: Payer: Medicare HMO | Admitting: Adult Health

## 2016-02-01 ENCOUNTER — Ambulatory Visit: Payer: Medicare HMO | Admitting: Adult Health

## 2016-02-03 ENCOUNTER — Encounter: Payer: Self-pay | Admitting: Adult Health

## 2016-02-15 ENCOUNTER — Ambulatory Visit (INDEPENDENT_AMBULATORY_CARE_PROVIDER_SITE_OTHER): Payer: Medicare HMO | Admitting: Adult Health

## 2016-02-15 ENCOUNTER — Encounter: Payer: Self-pay | Admitting: Adult Health

## 2016-02-15 VITALS — BP 108/65 | HR 64 | Ht 62.5 in | Wt 157.4 lb

## 2016-02-15 DIAGNOSIS — G250 Essential tremor: Secondary | ICD-10-CM

## 2016-02-15 NOTE — Patient Instructions (Signed)
Continue to monitor. If tremor worsens please let us know.

## 2016-02-15 NOTE — Progress Notes (Signed)
I have read the note, and I agree with the clinical assessment and plan.  Glennie Rodda KEITH   

## 2016-02-15 NOTE — Progress Notes (Signed)
PATIENT: Carol Freeman DOB: 01-07-1951  REASON FOR VISIT: follow up-essential tremor HISTORY FROM: patient  HISTORY OF PRESENT ILLNESS:  Today 02/15/2016: [name] is a 65 year old female with a history of benign essential tremor. She returns today for follow-up. The patient feels that her tremor has remained same. It only affects her hands. She is able to complete all ADLs independently. She is able to feed herself. She feels that the tremor may be exacerbated when she is nervous or in a rush. She states that there are some days she doesn't even have a tremor. She is currently not on any medication. She returns today for an evaluation.  HISTORY 01/19/15: [name] is a 65 year old female with a history of benign essential tremor. She returns today for follow-up. The patient is currently not on any medications for her tremors. She states that she primarily notices that if she becomes very nervous or  is in a rush. The tremor is primarily located in the hands. She will sometimes notice it in her hand writing or when eating. Patient denies any new neurological symptoms. She returns today for an evaluation.  HISTORY  07/22/14: [name] is a 65 year old female with a history of benign essential tremor. She returns today for follow-up. The patient states that she continues to have a tremor in the right hand. She continues tp notice it when she is writing or flossing her teeth. She states she will occasionally notice it when she is drinking tea--she has to hold the cup with 2 hands. At this time she denies having any issues when she is eating. Overall she feels that the tremor has not changed much. She would rather not be on medication unless it gets worse. She denies any new neurological symptoms. Denies any new medical issues. She returns today for evaluation  HISTORY 01/16/14 (WILLIS): [name] is a 65 year old right-handed black female with a history of tremor that she believes began about 4  months ago. The patient indicates that the tremor involves the right arm only, and she generally will note the tremor when she is performing handwriting, or trying to floss her teeth. The patient may drop things from the right hand. She reports no numbness or weakness or discomfort involving the arm. She denies any family history of tremor. She denies any tremor involving the head or neck, or any vocal tremor. The patient denies any balance problems or difficulty controlling the bowels or the bladder. She indicates that the tremor does not prevent her from performing any activities of daily living. The patient has undergone a CT scan of the brain in June of 2015, and the study was unremarkable. The patient is sent to this office for an evaluation. The patient has good days and bad days with the tremor.   REVIEW OF SYSTEMS: Out of a complete 14 system review of symptoms, the patient complains only of the following symptoms, and all other reviewed systems are negative.  See history of present illness  ALLERGIES: Allergies  Allergen Reactions  . Lipitor [Atorvastatin] Other (See Comments)    HOME MEDICATIONS: Outpatient Medications Prior to Visit  Medication Sig Dispense Refill  . aspirin 81 MG tablet Take 81 mg by mouth every other day.    . lisinopril-hydrochlorothiazide (PRINZIDE,ZESTORETIC) 10-12.5 MG per tablet     . pravastatin (PRAVACHOL) 20 MG tablet      No facility-administered medications prior to visit.     PAST MEDICAL HISTORY: Past Medical History:  Diagnosis Date  .  Cervical spondylosis   . Resting tremor    hand  . Tremor of right hand 01/16/2014    PAST SURGICAL HISTORY: Past Surgical History:  Procedure Laterality Date  . Broken leg    . GANGLION CYST EXCISION    . still born      FAMILY HISTORY: Family History  Problem Relation Age of Onset  . Tremor Neg Hx     SOCIAL HISTORY: Social History   Social History  . Marital status: Divorced    Spouse  name: N/A  . Number of children: 1  . Years of education: 612   Occupational History  . Retired    Social History Main Topics  . Smoking status: Never Smoker  . Smokeless tobacco: Never Used  . Alcohol use Yes     Comment: Red wine once in a while  . Drug use: No  . Sexual activity: Not on file   Other Topics Concern  . Not on file   Social History Narrative   Patient lives alone.    Patient is divorced.    Patient has 1 child.    Patient right handed.    Patient is retired.    Patient has a high school education.       PHYSICAL EXAM  Vitals:   02/15/16 1442  BP: 108/65  Pulse: 64  Weight: 157 lb 6.4 oz (71.4 kg)  Height: 5' 2.5" (1.588 m)   Body mass index is 28.33 kg/m.  Generalized: Well developed, in no acute distress   Neurological examination  Mentation: Alert oriented to time, place, history taking. Follows all commands speech and language fluent Cranial nerve II-XII: Pupils were equal round reactive to light. Extraocular movements were full, visual field were full on confrontational test. Facial sensation and strength were normal. Uvula tongue midline. Head turning and shoulder shrug  were normal and symmetric. Motor: The motor testing reveals 5 over 5 strength of all 4 extremities. Good symmetric motor tone is noted throughout.  Sensory: Sensory testing is intact to soft touch on all 4 extremities. No evidence of extinction is noted.  Coordination: Cerebellar testing reveals good finger-nose-finger and heel-to-shin bilaterally. Mild intention tremor noted bilaterally in the hands. Gait and station: Gait is normal. Tandem gait is normal. Romberg is negative. No drift is seen.  Reflexes: Deep tendon reflexes are symmetric and normal bilaterally.   DIAGNOSTIC DATA (LABS, IMAGING, TESTING) - I reviewed patient records, labs, notes, testing and imaging myself where available.   ASSESSMENT AND PLAN 65 y.o. year old female  has a past medical history of  Cervical spondylosis; Resting tremor; and Tremor of right hand (01/16/2014). here with:  1. Essential tremor  Overall the patient is doing well. She will continue to monitor her tremor. For now the tremor is very mild and does not require treatment with medication. Patient advised that if her symptoms worsen or she develops any new symptoms she should let us know. Follow-up in one year with Dr. Anne HahnWillis.    Butch PennyMegan Khristine Verno, MSN, NP-C 02/15/2016, 2:45 PM [facility] [address], [address] [place], [place] [address] [phone]

## 2016-02-22 ENCOUNTER — Ambulatory Visit: Payer: Medicare HMO | Admitting: Adult Health

## 2016-10-24 ENCOUNTER — Other Ambulatory Visit: Payer: Self-pay | Admitting: Internal Medicine

## 2016-10-24 DIAGNOSIS — Z1231 Encounter for screening mammogram for malignant neoplasm of breast: Secondary | ICD-10-CM

## 2016-11-10 ENCOUNTER — Ambulatory Visit: Payer: Medicare HMO

## 2016-11-11 ENCOUNTER — Ambulatory Visit
Admission: RE | Admit: 2016-11-11 | Discharge: 2016-11-11 | Disposition: A | Discharge status: Home / Self Care | Payer: Medicare HMO | Source: Ambulatory Visit | Attending: Internal Medicine | Admitting: Internal Medicine

## 2016-11-11 DIAGNOSIS — Z1231 Encounter for screening mammogram for malignant neoplasm of breast: Secondary | ICD-10-CM

## 2016-11-14 ENCOUNTER — Other Ambulatory Visit: Payer: Self-pay | Admitting: Internal Medicine

## 2016-11-14 DIAGNOSIS — R928 Other abnormal and inconclusive findings on diagnostic imaging of breast: Secondary | ICD-10-CM

## 2016-11-25 ENCOUNTER — Ambulatory Visit: Payer: Medicare HMO

## 2016-11-25 ENCOUNTER — Ambulatory Visit
Admission: RE | Admit: 2016-11-25 | Discharge: 2016-11-25 | Disposition: A | Discharge status: Home / Self Care | Payer: Medicare HMO | Source: Ambulatory Visit | Attending: Internal Medicine | Admitting: Internal Medicine

## 2016-11-25 DIAGNOSIS — R928 Other abnormal and inconclusive findings on diagnostic imaging of breast: Secondary | ICD-10-CM

## 2017-02-20 ENCOUNTER — Encounter: Payer: Self-pay | Admitting: Neurology

## 2017-02-20 ENCOUNTER — Ambulatory Visit (INDEPENDENT_AMBULATORY_CARE_PROVIDER_SITE_OTHER): Payer: Medicare HMO | Admitting: Neurology

## 2017-02-20 VITALS — BP 98/71 | HR 71 | Ht 62.5 in | Wt 156.0 lb

## 2017-02-20 DIAGNOSIS — R251 Tremor, unspecified: Secondary | ICD-10-CM

## 2017-02-20 NOTE — Progress Notes (Signed)
Reason for visit: Tremor  Carol BushmanDorothy Freeman is an 66 y.o. female  History of present illness:  Carol Freeman is a 66 year old right-handed black female with a history of an essential tremor that mainly involves Carol right arm, occasionally Carol left.  She will have good days and bad days with Carol tremor, she usually has worse tremor in Carol morning, better in Carol afternoon and evening.  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  She has not requested any medications for tremor in Carol past.  Carol Freeman has not had any severe progression of Carol tremor since last seen.  She returns for an evaluation.  Past Medical History:  Diagnosis Date  . Cervical spondylosis   . Resting tremor    hand  . Tremor of right hand 01/16/2014    Past Surgical History:  Procedure Laterality Date  . Broken leg    . GANGLION CYST EXCISION    . still born      Family History  Problem Relation Age of Onset  . Tremor Neg Hx   . Breast cancer Neg Hx     Social history:  reports that  has never smoked. she has never used smokeless tobacco. She reports that she drinks alcohol. She reports that she does not use drugs.    Allergies  Allergen Reactions  . Lipitor [Atorvastatin] Other (See Comments)    Medications:  Prior to Admission medications   Medication Sig Start Date End Date Taking? Authorizing Provider  aspirin 81 MG tablet Take 81 mg by mouth every other day.   Yes [provider]  lisinopril-hydrochlorothiazide (PRINZIDE,ZESTORETIC) 10-12.5 MG per tablet  11/06/13  Yes [provider]  pravastatin (PRAVACHOL) 20 MG tablet  11/05/13  Yes [provider]    ROS:  Out of a complete 14 system review of symptoms, Carol Freeman complains only of Carol following symptoms, and all other reviewed systems are negative.  Runny nose itching, eye pain Abdominal pain Frequent waking  Blood pressure 98/71, pulse 71, height 5' 2.5"  (1.588 m), weight 156 lb (70.8 kg).  Physical Exam  General: Carol Freeman is alert and cooperative at Carol time of Carol examination.  Skin: No significant peripheral edema is noted.   Neurologic Exam  Mental status: Carol Freeman is alert and oriented x 3 at Carol time of Carol examination. Carol Freeman has apparent normal recent and remote memory, with an apparently normal attention span and concentration ability.   Cranial nerves: Facial symmetry is present. Speech is normal, no aphasia or dysarthria is noted. Extraocular movements are full. Visual fields are full.  Motor: Carol Freeman has good strength in all 4 extremities.  Sensory examination: Soft touch sensation is symmetric on Carol face, arms, and legs.  Coordination: Carol Freeman has good finger-nose-finger and heel-to-shin bilaterally.  No tremors are seen with Carol arms outstretched, but with handwriting, tremor is translated into Carol handwriting.  Gait and station: Carol Freeman has a normal gait. Tandem gait is normal. Romberg is negative. No drift is seen.  Reflexes: Deep tendon reflexes are symmetric.   Assessment/Plan:  1.  Essential tremor  Carol Freeman does not wish to start on any medications, we will have her contact our office if she changes her mind, otherwise Carol Freeman will be seen on an as-needed basis.  Marlan Palau. Keith Sharlize Hoar MD 02/20/2017 10:33 AM  [facility] [address]  [place], [place] [address]  Phone [phone] Fax [phone]

## 2017-07-31 ENCOUNTER — Other Ambulatory Visit: Payer: Self-pay

## 2017-07-31 ENCOUNTER — Encounter (HOSPITAL_COMMUNITY): Payer: Self-pay

## 2017-07-31 ENCOUNTER — Emergency Department (HOSPITAL_COMMUNITY)
Admission: EM | Admit: 2017-07-31 | Discharge: 2017-07-31 | Disposition: A | Discharge status: Home / Self Care | Payer: No Typology Code available for payment source | Attending: Emergency Medicine | Admitting: Emergency Medicine

## 2017-07-31 DIAGNOSIS — S0992XA Unspecified injury of nose, initial encounter: Secondary | ICD-10-CM | POA: Diagnosis present

## 2017-07-31 DIAGNOSIS — W2219XA Striking against or struck by other automobile airbag, initial encounter: Secondary | ICD-10-CM | POA: Diagnosis not present

## 2017-07-31 DIAGNOSIS — Y9389 Activity, other specified: Secondary | ICD-10-CM | POA: Diagnosis not present

## 2017-07-31 DIAGNOSIS — J3489 Other specified disorders of nose and nasal sinuses: Secondary | ICD-10-CM

## 2017-07-31 DIAGNOSIS — R251 Tremor, unspecified: Secondary | ICD-10-CM | POA: Insufficient documentation

## 2017-07-31 DIAGNOSIS — S0033XA Contusion of nose, initial encounter: Secondary | ICD-10-CM | POA: Diagnosis not present

## 2017-07-31 DIAGNOSIS — Y92410 Unspecified street and highway as the place of occurrence of the external cause: Secondary | ICD-10-CM | POA: Diagnosis not present

## 2017-07-31 DIAGNOSIS — Y999 Unspecified external cause status: Secondary | ICD-10-CM | POA: Insufficient documentation

## 2017-07-31 DIAGNOSIS — Z7982 Long term (current) use of aspirin: Secondary | ICD-10-CM | POA: Insufficient documentation

## 2017-07-31 DIAGNOSIS — I10 Essential (primary) hypertension: Secondary | ICD-10-CM | POA: Diagnosis not present

## 2017-07-31 HISTORY — DX: Pure hypercholesterolemia, unspecified: E78.00

## 2017-07-31 HISTORY — DX: Essential (primary) hypertension: I10
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
## 2017-07-31 NOTE — Discharge Instructions (Signed)
Alternate between tylenol and ibuprofen for pain, using ibuprofen on a schedule for the next 2-3 days to help minimize the soreness you develop as time goes on. Use ice to your face/nose to help with pain and swelling. Ice to other areas of soreness for the next 24 hours and then may move to heat (but NOT ON YOUR FACE), no more than 20 minutes at a time every hour for each. Expect to be sore for the next few days and follow up with primary care physician for recheck of ongoing symptoms in the next 1 week. Return to ER for emergent changing or worsening of symptoms.
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
## 2017-11-07 ENCOUNTER — Other Ambulatory Visit: Payer: Self-pay | Admitting: Internal Medicine

## 2017-11-07 DIAGNOSIS — E2839 Other primary ovarian failure: Secondary | ICD-10-CM

## 2017-11-07 DIAGNOSIS — Z1231 Encounter for screening mammogram for malignant neoplasm of breast: Secondary | ICD-10-CM

## 2017-12-22 ENCOUNTER — Ambulatory Visit
Admission: RE | Admit: 2017-12-22 | Discharge: 2017-12-22 | Disposition: A | Discharge status: Home / Self Care | Payer: Medicare HMO | Source: Ambulatory Visit | Attending: Internal Medicine | Admitting: Internal Medicine

## 2017-12-22 DIAGNOSIS — E2839 Other primary ovarian failure: Secondary | ICD-10-CM

## 2017-12-22 DIAGNOSIS — Z1231 Encounter for screening mammogram for malignant neoplasm of breast: Secondary | ICD-10-CM

## 2018-10-30 ENCOUNTER — Other Ambulatory Visit: Payer: Self-pay | Admitting: Internal Medicine

## 2018-10-30 DIAGNOSIS — Z1231 Encounter for screening mammogram for malignant neoplasm of breast: Secondary | ICD-10-CM

## 2018-11-06 ENCOUNTER — Other Ambulatory Visit: Payer: Self-pay | Admitting: Internal Medicine

## 2018-11-06 DIAGNOSIS — E2839 Other primary ovarian failure: Secondary | ICD-10-CM

## 2019-01-23 ENCOUNTER — Ambulatory Visit
Admission: RE | Admit: 2019-01-23 | Discharge: 2019-01-23 | Disposition: A | Discharge status: Home / Self Care | Payer: Medicare HMO | Source: Ambulatory Visit | Attending: Internal Medicine | Admitting: Internal Medicine

## 2019-01-23 ENCOUNTER — Other Ambulatory Visit: Payer: Self-pay

## 2019-01-23 DIAGNOSIS — Z1231 Encounter for screening mammogram for malignant neoplasm of breast: Secondary | ICD-10-CM

## 2020-01-23 ENCOUNTER — Other Ambulatory Visit: Payer: Self-pay | Admitting: Internal Medicine

## 2020-01-23 DIAGNOSIS — Z1231 Encounter for screening mammogram for malignant neoplasm of breast: Secondary | ICD-10-CM

## 2020-02-14 ENCOUNTER — Other Ambulatory Visit: Payer: Self-pay

## 2020-02-14 ENCOUNTER — Ambulatory Visit
Admission: RE | Admit: 2020-02-14 | Discharge: 2020-02-14 | Disposition: A | Discharge status: Home / Self Care | Payer: Medicare HMO | Source: Ambulatory Visit | Attending: Internal Medicine | Admitting: Internal Medicine

## 2020-02-14 DIAGNOSIS — Z1231 Encounter for screening mammogram for malignant neoplasm of breast: Secondary | ICD-10-CM

## 2021-01-18 ENCOUNTER — Other Ambulatory Visit: Payer: Self-pay | Admitting: Internal Medicine

## 2021-01-18 DIAGNOSIS — Z1231 Encounter for screening mammogram for malignant neoplasm of breast: Secondary | ICD-10-CM

## 2021-01-26 ENCOUNTER — Other Ambulatory Visit: Payer: Self-pay | Admitting: Internal Medicine

## 2021-01-27 LAB — URINE CULTURE
MICRO NUMBER:: 12488028
Result:: NO GROWTH
SPECIMEN QUALITY:: ADEQUATE

## 2021-02-17 ENCOUNTER — Other Ambulatory Visit: Payer: Self-pay

## 2021-02-17 ENCOUNTER — Ambulatory Visit
Admission: RE | Admit: 2021-02-17 | Discharge: 2021-02-17 | Disposition: A | Discharge status: Home / Self Care | Payer: Medicare HMO | Source: Ambulatory Visit | Attending: Internal Medicine | Admitting: Internal Medicine

## 2021-02-17 DIAGNOSIS — Z1231 Encounter for screening mammogram for malignant neoplasm of breast: Secondary | ICD-10-CM

## 2021-11-09 DIAGNOSIS — Z1239 Encounter for other screening for malignant neoplasm of breast: Secondary | ICD-10-CM | POA: Diagnosis not present

## 2021-11-09 DIAGNOSIS — Z0000 Encounter for general adult medical examination without abnormal findings: Secondary | ICD-10-CM | POA: Diagnosis not present

## 2021-11-09 DIAGNOSIS — R7303 Prediabetes: Secondary | ICD-10-CM | POA: Diagnosis not present

## 2021-11-09 DIAGNOSIS — I10 Essential (primary) hypertension: Secondary | ICD-10-CM | POA: Diagnosis not present

## 2021-11-09 DIAGNOSIS — E7849 Other hyperlipidemia: Secondary | ICD-10-CM | POA: Diagnosis not present

## 2021-11-16 DIAGNOSIS — H04123 Dry eye syndrome of bilateral lacrimal glands: Secondary | ICD-10-CM | POA: Diagnosis not present

## 2022-02-17 ENCOUNTER — Other Ambulatory Visit: Payer: Self-pay | Admitting: Internal Medicine

## 2022-02-17 DIAGNOSIS — Z1231 Encounter for screening mammogram for malignant neoplasm of breast: Secondary | ICD-10-CM

## 2022-02-21 ENCOUNTER — Ambulatory Visit
Admission: RE | Admit: 2022-02-21 | Discharge: 2022-02-21 | Disposition: A | Discharge status: Home / Self Care | Payer: Medicare HMO | Source: Ambulatory Visit | Attending: Internal Medicine | Admitting: Internal Medicine

## 2022-02-21 DIAGNOSIS — Z1231 Encounter for screening mammogram for malignant neoplasm of breast: Secondary | ICD-10-CM | POA: Diagnosis not present

## 2022-05-05 DIAGNOSIS — H40023 Open angle with borderline findings, high risk, bilateral: Secondary | ICD-10-CM | POA: Diagnosis not present

## 2022-05-05 DIAGNOSIS — H04123 Dry eye syndrome of bilateral lacrimal glands: Secondary | ICD-10-CM | POA: Diagnosis not present

## 2022-05-05 DIAGNOSIS — H02051 Trichiasis without entropian right upper eyelid: Secondary | ICD-10-CM | POA: Diagnosis not present

## 2022-05-05 DIAGNOSIS — R7309 Other abnormal glucose: Secondary | ICD-10-CM | POA: Diagnosis not present

## 2022-05-10 DIAGNOSIS — R7303 Prediabetes: Secondary | ICD-10-CM | POA: Diagnosis not present

## 2022-05-10 DIAGNOSIS — Z23 Encounter for immunization: Secondary | ICD-10-CM | POA: Diagnosis not present

## 2022-05-10 DIAGNOSIS — K5909 Other constipation: Secondary | ICD-10-CM | POA: Diagnosis not present

## 2022-05-10 DIAGNOSIS — I10 Essential (primary) hypertension: Secondary | ICD-10-CM | POA: Diagnosis not present

## 2022-05-10 DIAGNOSIS — M169 Osteoarthritis of hip, unspecified: Secondary | ICD-10-CM | POA: Diagnosis not present

## 2022-11-08 ENCOUNTER — Other Ambulatory Visit: Payer: Self-pay | Admitting: Internal Medicine

## 2022-11-09 LAB — COMPLETE METABOLIC PANEL WITH GFR
AG Ratio: 1.5 (calc) (ref 1.0–2.5)
ALT: 17 U/L (ref 6–29)
AST: 20 U/L (ref 10–35)
Albumin: 4.7 g/dL (ref 3.6–5.1)
Alkaline phosphatase (APISO): 41 U/L (ref 37–153)
BUN/Creatinine Ratio: 31 (calc) — ABNORMAL HIGH (ref 6–22)
BUN: 29 mg/dL — ABNORMAL HIGH (ref 7–25)
CO2: 24 mmol/L (ref 20–32)
Calcium: 10 mg/dL (ref 8.6–10.4)
Chloride: 101 mmol/L (ref 98–110)
Creat: 0.95 mg/dL (ref 0.60–1.00)
Globulin: 3.2 g/dL (calc) (ref 1.9–3.7)
Glucose, Bld: 88 mg/dL (ref 65–99)
Potassium: 4.1 mmol/L (ref 3.5–5.3)
Sodium: 140 mmol/L (ref 135–146)
Total Bilirubin: 0.7 mg/dL (ref 0.2–1.2)
Total Protein: 7.9 g/dL (ref 6.1–8.1)
eGFR: 63 mL/min/{1.73_m2} (ref >=60)

## 2022-11-09 LAB — CBC
HCT: 41.4 % (ref 35.0–45.0)
Hemoglobin: 12.3 g/dL (ref 11.7–15.5)
MCH: 23.8 pg — ABNORMAL LOW (ref 27.0–33.0)
MCHC: 29.7 g/dL — ABNORMAL LOW (ref 32.0–36.0)
MCV: 80.1 fL (ref 80.0–100.0)
MPV: 10.4 fL (ref 7.5–12.5)
Platelets: 378 10*3/uL (ref 140–400)
RBC: 5.17 10*6/uL — ABNORMAL HIGH (ref 3.80–5.10)
RDW: 14.2 % (ref 11.0–15.0)
WBC: 4.5 10*3/uL (ref 3.8–10.8)

## 2022-11-09 LAB — LIPID PANEL
Cholesterol: 225 mg/dL — ABNORMAL HIGH (ref <200)
HDL: 75 mg/dL (ref >=50)
LDL Cholesterol (Calc): 130 mg/dL (calc) — ABNORMAL HIGH
Non-HDL Cholesterol (Calc): 150 mg/dL (calc) — ABNORMAL HIGH (ref <130)
Total CHOL/HDL Ratio: 3 (calc) (ref <5.0)
Triglycerides: 97 mg/dL (ref <150)

## 2022-11-09 LAB — TSH: TSH: 1 mIU/L (ref 0.40–4.50)

## 2023-02-22 ENCOUNTER — Other Ambulatory Visit: Payer: Self-pay | Admitting: Internal Medicine

## 2023-02-22 DIAGNOSIS — Z1231 Encounter for screening mammogram for malignant neoplasm of breast: Secondary | ICD-10-CM

## 2023-03-22 ENCOUNTER — Ambulatory Visit: Payer: Medicare HMO

## 2023-04-30 ENCOUNTER — Emergency Department (HOSPITAL_COMMUNITY)
Admission: EM | Admit: 2023-04-30 | Discharge: 2023-05-01 | Disposition: A | Discharge status: Home / Self Care | Payer: Medicare HMO | Attending: Emergency Medicine | Admitting: Emergency Medicine

## 2023-04-30 ENCOUNTER — Encounter (HOSPITAL_COMMUNITY): Payer: Self-pay

## 2023-04-30 ENCOUNTER — Emergency Department (HOSPITAL_COMMUNITY): Payer: Medicare HMO

## 2023-04-30 DIAGNOSIS — I10 Essential (primary) hypertension: Secondary | ICD-10-CM | POA: Diagnosis not present

## 2023-04-30 DIAGNOSIS — R319 Hematuria, unspecified: Secondary | ICD-10-CM | POA: Insufficient documentation

## 2023-04-30 DIAGNOSIS — R911 Solitary pulmonary nodule: Secondary | ICD-10-CM | POA: Insufficient documentation

## 2023-04-30 DIAGNOSIS — Z79899 Other long term (current) drug therapy: Secondary | ICD-10-CM | POA: Diagnosis not present

## 2023-04-30 DIAGNOSIS — Z7982 Long term (current) use of aspirin: Secondary | ICD-10-CM | POA: Diagnosis not present

## 2023-04-30 DIAGNOSIS — R7989 Other specified abnormal findings of blood chemistry: Secondary | ICD-10-CM | POA: Diagnosis not present

## 2023-04-30 DIAGNOSIS — R109 Unspecified abdominal pain: Secondary | ICD-10-CM | POA: Diagnosis present

## 2023-04-30 LAB — URINALYSIS, ROUTINE W REFLEX MICROSCOPIC
Bilirubin Urine: NEGATIVE
Glucose, UA: NEGATIVE mg/dL
Ketones, ur: NEGATIVE mg/dL
Leukocytes,Ua: NEGATIVE
Nitrite: NEGATIVE
Protein, ur: NEGATIVE mg/dL
Specific Gravity, Urine: 1.012 (ref 1.005–1.030)
pH: 5 (ref 5.0–8.0)

## 2023-04-30 LAB — HEPATIC FUNCTION PANEL
ALT: 20 U/L (ref 0–44)
AST: 21 U/L (ref 15–41)
Albumin: 4 g/dL (ref 3.5–5.0)
Alkaline Phosphatase: 40 U/L (ref 38–126)
Bilirubin, Direct: <0.1 mg/dL (ref 0.0–0.2)
Total Bilirubin: 0.7 mg/dL (ref 0.0–1.2)
Total Protein: 8 g/dL (ref 6.5–8.1)

## 2023-04-30 LAB — BASIC METABOLIC PANEL
Anion gap: 9 (ref 5–15)
BUN: 20 mg/dL (ref 8–23)
CO2: 25 mmol/L (ref 22–32)
Calcium: 9.4 mg/dL (ref 8.9–10.3)
Chloride: 102 mmol/L (ref 98–111)
Creatinine, Ser: 1.22 mg/dL — ABNORMAL HIGH (ref 0.44–1.00)
GFR, Estimated: 47 mL/min — ABNORMAL LOW (ref >60)
Glucose, Bld: 105 mg/dL — ABNORMAL HIGH (ref 70–99)
Potassium: 4.1 mmol/L (ref 3.5–5.1)
Sodium: 136 mmol/L (ref 135–145)

## 2023-04-30 LAB — CBC
HCT: 41 % (ref 36.0–46.0)
Hemoglobin: 12.6 g/dL (ref 12.0–15.0)
MCH: 23.9 pg — ABNORMAL LOW (ref 26.0–34.0)
MCHC: 30.7 g/dL (ref 30.0–36.0)
MCV: 77.8 fL — ABNORMAL LOW (ref 80.0–100.0)
Platelets: 307 10*3/uL (ref 150–400)
RBC: 5.27 MIL/uL — ABNORMAL HIGH (ref 3.87–5.11)
RDW: 15 % (ref 11.5–15.5)
WBC: 6.3 10*3/uL (ref 4.0–10.5)
nRBC: 0 % (ref 0.0–0.2)

## 2023-04-30 LAB — TROPONIN I (HIGH SENSITIVITY): Troponin I (High Sensitivity): 8 ng/L (ref <18)

## 2023-04-30 LAB — LIPASE, BLOOD: Lipase: 36 U/L (ref 11–51)

## 2023-04-30 MED ORDER — ACETAMINOPHEN 325 MG PO TABS
650.0000 mg | ORAL_TABLET | Freq: Once | ORAL | Status: AC
  Administered 2023-04-30: 650 mg via ORAL
  Filled 2023-04-30: qty 2

## 2023-04-30 NOTE — ED Provider Notes (Signed)
 Emergency Department Provider Note  TRIAGE NOTE: Pt is coming home by way of medic, she called out for left flank pain that has been going on for week, states she feels a knot in her left side, hurts to ambulate, hurts to twist or turn as well. She does not have any other complaints.   Medic vitals   144/72 72hr 18rr 98%ra 145bgl  HISTORY  Chief Complaint Flank Pain   HPI Carol Freeman is a 73 y.o. female with  a couple different symptoms.  Her main symptom is that she has had about a weeks worth of left-sided flank pain.  She states that seems to radiate around her left mid scapular line around the eighth or ninth rib it radiates down towards her pelvis and then also radiates around underneath her left breast.  No rash.  No fever.  Does not radiate anywhere else.  Seems to be worse with movement and taking deep breath and coughing.  No active cough.  She states that a few days ago she was going to the bathroom and passed out on the commode.  No preceding events.  She woke up and there was vomit everywhere.  Does not usually pass out and is never any pain like this before.  This pain started before that incident.  She has had a couple episodes of palpitations like her heart might be skipping a beat.  No chest pain. Has tried some OTC meds and different creams none of which have helped much.  PMH Past Medical History:  Diagnosis Date   Cervical spondylosis    High cholesterol    Hypertension    Resting tremor    hand   Tremor of right hand 01/16/2014    Home Medications Prior to Admission medications   Medication Sig Start Date End Date Taking? Authorizing Provider  oxyCODONE -acetaminophen  (PERCOCET) 5-325 MG tablet Take 1 tablet by mouth every 12 (twelve) hours as needed. 05/01/23  Yes Tammy Ericsson, Selinda, MD  pantoprazole  (PROTONIX ) 40 MG tablet Take 1 tablet (40 mg total) by mouth daily. 05/01/23  Yes Cheron Coryell, Selinda, MD  sucralfate  (CARAFATE ) 1 GM/10ML suspension Take 10 mLs (1 g  total) by mouth 4 (four) times daily -  with meals and at bedtime. 05/01/23  Yes Allysen Lazo, Selinda, MD  aspirin 81 MG tablet Take 81 mg by mouth every other day.    [provider]  lisinopril-hydrochlorothiazide (PRINZIDE,ZESTORETIC) 10-12.5 MG per tablet  11/06/13   [provider]  pravastatin (PRAVACHOL) 20 MG tablet  11/05/13   [provider]    Social History Social History   Tobacco Use   Smoking status: Never   Smokeless tobacco: Never  Vaping Use   Vaping status: Never Used  Substance Use Topics   Alcohol use: Yes    Comment: Red wine once in a while   Drug use: No    Review of Systems: Documented in HPI ____________________________________________  PHYSICAL EXAM: VITAL SIGNS: ED Triage Vitals [04/30/23 2149]  Encounter Vitals Group     BP (!) 151/74     Systolic BP Percentile      Diastolic BP Percentile      Pulse Rate 70     Resp 16     Temp 98.4 F (36.9 C)     Temp Source Oral     SpO2 100 %     Weight      Height      Head Circumference      Peak  Flow      Pain Score 8     Pain Loc      Pain Education      Exclude from Growth Chart    Physical Exam Vitals and nursing note reviewed.  Constitutional:      Appearance: She is well-developed.  HENT:     Head: Normocephalic and atraumatic.  Cardiovascular:     Rate and Rhythm: Normal rate and regular rhythm.  Pulmonary:     Effort: No respiratory distress.     Breath sounds: No stridor.  Abdominal:     General: There is no distension.  Musculoskeletal:        General: No deformity.     Cervical back: Normal range of motion.  Skin:    General: Skin is warm and dry.     Comments: Chaperoned by nurse Janeth, no rash, no obvious rib/spinal deformities. No obvious erythema, induration or fluctuance around areas of pain.   Neurological:     Mental Status: She is alert.       ____________________________________________   LABS (all labs ordered are listed, but only  abnormal results are displayed)  Labs Reviewed  URINALYSIS, ROUTINE W REFLEX MICROSCOPIC - Abnormal; Notable for the following components:      Result Value   Color, Urine STRAW (*)    Hgb urine dipstick SMALL (*)    Bacteria, UA RARE (*)    All other components within normal limits  CBC - Abnormal; Notable for the following components:   RBC 5.27 (*)    MCV 77.8 (*)    MCH 23.9 (*)    All other components within normal limits  BASIC METABOLIC PANEL - Abnormal; Notable for the following components:   Glucose, Bld 105 (*)    Creatinine, Ser 1.22 (*)    GFR, Estimated 47 (*)    All other components within normal limits  LIPASE, BLOOD  HEPATIC FUNCTION PANEL  TROPONIN I (HIGH SENSITIVITY)  TROPONIN I (HIGH SENSITIVITY)   ____________________________________________  EKG   EKG Interpretation Date/Time:  Sunday April 30 2023 22:32:54 EST Ventricular Rate:  66 PR Interval:  162 QRS Duration:  94 QT Interval:  388 QTC Calculation: 406 R Axis:   19  Text Interpretation: Normal sinus rhythm with sinus arrhythmia Nonspecific ST abnormality Abnormal ECG When compared with ECG of 07-May-2007 11:58, PREVIOUS ECG IS PRESENT Confirmed by Lorette Mayo [phone]) on 04/30/2023 10:58:01 PM        ____________________________________________  RADIOLOGY  CT Renal Stone Study Result Date: 05/01/2023 CLINICAL DATA:  Abdominal/flank pain.  Stone suspected. EXAM: CT ABDOMEN AND PELVIS WITHOUT CONTRAST TECHNIQUE: Multidetector CT imaging of the abdomen and pelvis was performed following the standard protocol without IV contrast. RADIATION DOSE REDUCTION: This exam was performed according to the departmental dose-optimization program which includes automated exposure control, adjustment of the mA and/or kV according to patient size and/or use of iterative reconstruction technique. COMPARISON:  CT without contrast 05/24/2010 FINDINGS: Lower chest: Lung bases demonstrate mild subpleural  reticulation and posterior atelectasis. No acute process is evident. There is a 4 mm rounded ground-glass right lower lobe nodule on 5:10, 2 adjacent rounded 3 mm ground-glass nodules in the left lower lobe on 5:8, and a 4 mm ground-glass left lower lobe nodule on 5:12. These nodules are above the plane of prior imaging and may or may not have been previously present. Lung bases are otherwise clear. There is mild cylindrical bronchiectasis in the posterior basal lower lobes. The cardiac  size is normal. Hepatobiliary: There previously was a 2 cm hypodense lesion in hepatic segment 8. This is no longer seen. The unenhanced liver is homogeneous. No masses seen without contrast. The gallbladder and bile ducts are unremarkable. Pancreas: Unremarkable without contrast. Spleen: Unremarkable without contrast.  No splenomegaly. Adrenals/Urinary Tract: There is no adrenal mass no contour deforming abnormality of the unenhanced kidneys. There is no urinary stone or obstruction. The bladder is unremarkable for the degree of distention. Stomach/Bowel: There are thickened folds in the stomach, greater proximally, likely gastritis or peptic ulcer disease. No inflammatory changes or penetrating ulcer. The unopacified small bowel is normal caliber without inflammatory changes. The retrocecal appendix is normal. There is moderate retained stool in the ascending and transverse colon. Descending and sigmoid diverticulosis is seen without evidence of an acute diverticulitis. Vascular/Lymphatic: Aortic atherosclerosis. No enlarged abdominal or pelvic lymph nodes. Reproductive: Uterus and bilateral adnexa are unremarkable. Other: Small umbilical fat hernia. No incarcerated hernia. No free fluid or free air. No focal inflammatory process. Musculoskeletal: Osteopenia. Advanced degenerative disc disease with vacuum phenomenon at L4-5 and L5-S1. Spondylosis. No acute or other significant osseous process. IMPRESSION: 1. No urinary stone or  obstruction. 2. Constipation and diverticulosis. 3. Thickened folds in the stomach, greater proximally, likely gastritis or peptic ulcer disease. No inflammatory changes or penetrating ulcer. 4. Aortic atherosclerosis. 5. Small umbilical fat hernia. 6. Osteopenia and degenerative change. 7. 3-4 mm ground-glass nodules in the lower lobes. Per Fleischner Society Guidelines, recommend a non-contrast Chest CT at 3-6 months. If stable, consider follow-up non-contrast Chest CTs at 2 and 4 years. These guidelines do not apply to immunocompromised patients and patients with cancer. Follow up in patients with significant comorbidities as clinically warranted. For lung cancer screening, adhere to Lung-RADS guidelines. Reference: Radiology. 2017; 284(1):228-43. Aortic Atherosclerosis (ICD10-I70.0). Electronically Signed   By: Francis Quam M.D.   On: 05/01/2023 01:01   CT Head Wo Contrast Result Date: 05/01/2023 CLINICAL DATA:  Syncope, vomiting EXAM: CT HEAD WITHOUT CONTRAST TECHNIQUE: Contiguous axial images were obtained from the base of the skull through the vertex without intravenous contrast. RADIATION DOSE REDUCTION: This exam was performed according to the departmental dose-optimization program which includes automated exposure control, adjustment of the mA and/or kV according to patient size and/or use of iterative reconstruction technique. COMPARISON:  09/23/2013 FINDINGS: Brain: No evidence of acute infarction, hemorrhage, hydrocephalus, extra-axial collection or mass lesion/mass effect. Mild subcortical white matter and periventricular small vessel ischemic changes. Vascular: Intracranial atherosclerosis. Skull: Normal. Negative for fracture or focal lesion. Sinuses/Orbits: The visualized paranasal sinuses are essentially clear. The mastoid air cells are unopacified. Other: None. IMPRESSION: No acute intracranial abnormality. Mild small vessel ischemic changes. Electronically Signed   By: Pinkie Pebbles M.D.    On: 05/01/2023 00:02   DG Pelvis 1-2 Views Result Date: 04/30/2023 CLINICAL DATA:  Pain. EXAM: PELVIS - 1-2 VIEW COMPARISON:  None Available. FINDINGS: The cortical margins of the bony pelvis are intact. No fracture. Pubic symphysis and sacroiliac joints are congruent. Both femoral heads are well-seated in the respective acetabula. IMPRESSION: No pelvic fracture. Electronically Signed   By: Andrea Gasman M.D.   On: 04/30/2023 23:40   DG Chest 2 View Result Date: 04/30/2023 CLINICAL DATA:  Chest pain. EXAM: CHEST - 2 VIEW COMPARISON:  05/29/2009 FINDINGS: The cardiomediastinal contours are normal. The lungs are clear. Pulmonary vasculature is normal. No consolidation, pleural effusion, or pneumothorax. No acute osseous abnormalities are seen. IMPRESSION: No active cardiopulmonary disease. Electronically Signed  By: Andrea Gasman M.D.   On: 04/30/2023 23:39   ____________________________________________  PROCEDURES  Procedure(s) performed:   Procedures ____________________________________________  INITIAL IMPRESSION / ASSESSMENT AND PLAN   This patient presents to the ED for concern of flank pain and syncope, this involves an extensive number of treatment options, and is a complaint that carries with it a high risk of complications and morbidity.  Based on exam, history of present illness I think the most likely etiology is MSK, however am considering ACS, PE, lytic lesions, pancreatitis, gastritis, splenic infarct as well but thought to be less likely based on H&P and workup to this point.   Additional history obtained:  Additional history obtained from n/a Previous records obtained and reviewed in epic  Co morbidities that complicate the patient evaluation  N/a  Social Determinants of Health:  N/a  Initial Plan:  Ct head, ecg and lytes for syncope.  UA, xr's for pain.   Screening labs including CBC and Metabolic panel to evaluate for infectious or metabolic etiology of  disease.  Urinalysis with reflex culture ordered to evaluate for UTI or relevant urologic/nephrologic pathology.  CXR to evaluate for structural/infectious intrathoracic pathology.  EKG to evaluate for cardiac pathology. Objective evaluation as below reviewed with plan for close reassessment  ED Course  Images ordered viewed and obtained by myself. Agree with Radiology interpretation. Details in ED course.  Labs ordered reviewed by myself as detailed in ED course.  Consultations obtained/considered detailed in ED course.   Clinical Course as of 05/01/23 0234  [place] Apr 30, 2023  2353 Hematuria noted on urinalysis. Will add on ct stone study. Creatinine slightly elevated compared to July as well.  [JM]    Clinical Course User Index [JM] Gissel Keilman, Selinda, MD      Cardiac Monitoring:  The patient was maintained on a cardiac monitor.  I personally viewed and interpreted the cardiac monitored which showed an underlying rhythm of: sinus  CRITICAL INTERVENTIONS:  N/a  Reevaluation:  After the interventions noted above, I reevaluated the patient and found that they have :improved  Patient found to have elevated creatinine and microscopic hematuria so ct stone done and negative for stone or any other acute pathology.  She is aware of the incidental finding of pulmonary nodules.  Is a non-smoker but will follow-up with doctor for follow-up CT scan.  Does show some possible gastritis/ulcer so we will treat her for that as that could explain her symptoms although not entirely.  Has constipation on CT but not clinically.  Will treat presumptively for peptic ulcer disease with PCP follow-up in a week to recheck urine, kidney function and decide on any further workup as a renal issue could call this such as a small carcinoma or nephritis. Suspect if she had any type of occlusion or embolus of spleen/kidney for a week she would have findings on ct scan and more abnormal labs. Symptoms improved here with  pain meds, will d/c on short course of same and advised against driving with those.   FINAL IMPRESSION AND PLAN Final diagnoses:  Left flank pain  Elevated serum creatinine  Hematuria, unspecified type  Pulmonary nodule    A medical screening exam was performed and I feel the patient has had an appropriate workup for their chief complaint at this time and likelihood of emergent condition existing is low. They have been counseled on decision, DISCHARGE, follow up and which symptoms necessitate immediate return to the emergency department. They or their family verbally  stated understanding and agreement with plan and discharged in stable condition.   ____________________________________________   NEW OUTPATIENT MEDICATIONS STARTED DURING THIS VISIT:  New Prescriptions   OXYCODONE -ACETAMINOPHEN  (PERCOCET) 5-325 MG TABLET    Take 1 tablet by mouth every 12 (twelve) hours as needed.   PANTOPRAZOLE  (PROTONIX ) 40 MG TABLET    Take 1 tablet (40 mg total) by mouth daily.   SUCRALFATE  (CARAFATE ) 1 GM/10ML SUSPENSION    Take 10 mLs (1 g total) by mouth 4 (four) times daily -  with meals and at bedtime.    Note:  This note was prepared with assistance of Dragon voice recognition software. Occasional wrong-word or sound-a-like substitutions may have occurred due to the inherent limitations of voice recognition software.    Jahmani Staup, Selinda, MD 05/01/23 [phone]

## 2023-04-30 NOTE — ED Triage Notes (Signed)
 Pt is coming home by way of medic, she called out for left flank pain that has been going on for week, states she feels a knot in her left side, hurts to ambulate, hurts to twist or turn as well. She does not have any other complaints.   Medic vitals   144/72 72hr 18rr 98%ra 145bgl

## 2023-04-30 NOTE — ED Notes (Signed)
 Patient transported to X-ray

## 2023-04-30 NOTE — ED Notes (Signed)
 Patient returned from X-ray

## 2023-04-30 NOTE — ED Notes (Signed)
 Patient transported to CT

## 2023-04-30 NOTE — ED Provider Triage Note (Signed)
 Emergency Medicine Provider Triage Evaluation Note  Carol Freeman , a 73 y.o. female  was evaluated in triage.  Pt complains of left sided flank pain. Has had pain in her lower L flank for a number of months. Now with pain in her L upper flank/lower lateral chest x3 weeks. Pain radiates under her L breast and to her central chest. It is not aggravated by breathing or exertion. She has tried topical creams for pain w/o relief. No hx of fevers, trauma, hemoptysis, leg swelling, urinary symptoms. Denies known hx of ACS.  Review of Systems  Positive: As above Negative: As above  Physical Exam  BP (!) 151/74 (BP Location: Left Arm)   Pulse 70   Temp 98.4 F (36.9 C) (Oral)   Resp 16   SpO2 100%  Gen:   Awake, no distress   Resp:  Normal effort  MSK:   Moves extremities without difficulty  Other:  Heart RRR.  Medical Decision Making  Medically screening exam initiated at 10:24 PM.  Appropriate orders placed.  Carol Freeman was informed that the remainder of the evaluation will be completed by another provider, this initial triage assessment does not replace that evaluation, and the importance of remaining in the ED until their evaluation is complete.  L flank pain - work up initiated. VSS.   Keith Sor, PA-C 04/30/23 2234

## 2023-05-01 ENCOUNTER — Emergency Department (HOSPITAL_COMMUNITY): Payer: Medicare HMO

## 2023-05-01 DIAGNOSIS — R109 Unspecified abdominal pain: Secondary | ICD-10-CM | POA: Diagnosis not present

## 2023-05-01 LAB — TROPONIN I (HIGH SENSITIVITY): Troponin I (High Sensitivity): 9 ng/L (ref <18)

## 2023-05-01 MED ORDER — FENTANYL CITRATE PF 50 MCG/ML IJ SOSY
50.0000 ug | PREFILLED_SYRINGE | Freq: Once | INTRAMUSCULAR | Status: AC
  Administered 2023-05-01: 50 ug via INTRAVENOUS
  Filled 2023-05-01: qty 1

## 2023-05-01 MED ORDER — SUCRALFATE 1 GM/10ML PO SUSP: 1.0000 g | Freq: Three times a day (TID) | ORAL | 0 refills | Status: AC

## 2023-05-01 MED ORDER — PANTOPRAZOLE SODIUM 40 MG PO TBEC
40.0000 mg | DELAYED_RELEASE_TABLET | Freq: Once | ORAL | Status: AC
Start: 2023-05-01 — End: 2023-05-01
  Administered 2023-05-01: 40 mg via ORAL
  Filled 2023-05-01: qty 1

## 2023-05-01 MED ORDER — PANTOPRAZOLE SODIUM 40 MG PO TBEC: 40.0000 mg | DELAYED_RELEASE_TABLET | Freq: Every day | ORAL | 0 refills | Status: AC

## 2023-05-01 MED ORDER — OXYCODONE-ACETAMINOPHEN 5-325 MG PO TABS: 1.0000 | ORAL_TABLET | Freq: Two times a day (BID) | ORAL | 0 refills | Status: AC | PRN

## 2023-05-22 ENCOUNTER — Ambulatory Visit
Admission: RE | Admit: 2023-05-22 | Discharge: 2023-05-22 | Disposition: A | Discharge status: Home / Self Care | Payer: Medicare HMO | Source: Ambulatory Visit | Attending: Internal Medicine | Admitting: Internal Medicine

## 2023-05-22 DIAGNOSIS — Z1231 Encounter for screening mammogram for malignant neoplasm of breast: Secondary | ICD-10-CM
